# Patient Record
Sex: Male | Born: 1960 | Race: White | Hispanic: No | Marital: Married | Smoking: Never smoker
Health system: Southern US, Community
[De-identification: ages and names within clinical notes are randomized; demographics above are authoritative.]

## PROBLEM LIST (undated history)

## (undated) DIAGNOSIS — L511 Stevens-Johnson syndrome: Secondary | ICD-10-CM

## (undated) DIAGNOSIS — IMO0001 Reserved for inherently not codable concepts without codable children: Secondary | ICD-10-CM

## (undated) DIAGNOSIS — E781 Pure hyperglyceridemia: Secondary | ICD-10-CM

## (undated) DIAGNOSIS — I10 Essential (primary) hypertension: Secondary | ICD-10-CM

## (undated) HISTORY — DX: Reserved for inherently not codable concepts without codable children: IMO0001

## (undated) HISTORY — PX: HX NO SURGICAL PROCEDURES: 2100001501

## (undated) HISTORY — DX: Stevens-Johnson syndrome (CMS HCC): L51.1

## (undated) HISTORY — DX: Pure hyperglyceridemia: E78.1

## (undated) HISTORY — DX: Essential (primary) hypertension: I10

---

## 2007-03-04 ENCOUNTER — Ambulatory Visit
Admission: RE | Admit: 2007-03-04 | Disposition: A | Discharge status: Home / Self Care | Payer: Self-pay | Source: Ambulatory Visit

## 2009-06-08 ENCOUNTER — Ambulatory Visit (INDEPENDENT_AMBULATORY_CARE_PROVIDER_SITE_OTHER): Payer: No Typology Code available for payment source

## 2009-06-08 ENCOUNTER — Encounter (INDEPENDENT_AMBULATORY_CARE_PROVIDER_SITE_OTHER): Payer: Self-pay

## 2009-06-08 VITALS — BP 118/86 | HR 78 | Temp 97.8°F | Resp 16 | Ht 70.0 in | Wt 219.0 lb

## 2009-06-08 LAB — POCT RAPID STREP A: RAPID STREP A (POCT): NEGATIVE

## 2009-06-08 MED ORDER — PREDNISONE 10 MG TABLET
10.0000 mg | ORAL_TABLET | Freq: Every day | ORAL | Status: DC
Start: 2009-06-08 — End: 2010-07-28

## 2009-06-08 NOTE — Progress Notes (Signed)
In-Office Rapid Strep performed. Results are Negative.   Richard Rummage, CNA-MR

## 2009-06-08 NOTE — Progress Notes (Signed)
SUBJECTIVE:   Richard Ochoa is a 49 y.o. male who is here today for sore throat since yesterday. States his daughter had the strep last week. Denies any fever and chills. Has some congestion as well, +PND and ear pain states he cannot hear from both his ears. He often gets cerumen build up.     OBJECTIVE:  He appears well, vital signs are as noted by the nurse. Ears +cerumen impaction bilaterally.  Throat and pharynx erythematous but no exudates nor petechiae.  Neck supple. Few adenopathy in the neck more on the right side. Nose is congested. Sinuses non-tender. The chest is clear, without wheezes or rales.  Rapid strep negative    ASSESSMENT:   Impacted cerumen  Pharyngitis  rhinitis    PLAN:  Ear lavage done and was successful in removing the cerumen. Advised that he use debrox of H2O2 solution for ear wax removal regularly. Do not use Q-tips.  Prednisone for the sore throat x 3 days, warm water gargle. Symptomatic therapy suggested: push fluids, rest, gargle warm salt water, use vaporizer or mist prn and ROV prn if symptoms persist or worsen. Call or return to clinic prn if these symptoms worsen or fail to improve as anticipated.

## 2010-07-28 ENCOUNTER — Ambulatory Visit (INDEPENDENT_AMBULATORY_CARE_PROVIDER_SITE_OTHER): Payer: No Typology Code available for payment source

## 2010-07-28 ENCOUNTER — Encounter (INDEPENDENT_AMBULATORY_CARE_PROVIDER_SITE_OTHER): Payer: Self-pay

## 2010-07-28 ENCOUNTER — Ambulatory Visit: Admission: RE | Admit: 2010-07-28 | Discharge: 2010-07-28 | Payer: Self-pay | Source: Ambulatory Visit

## 2010-07-28 VITALS — BP 130/84 | HR 91 | Resp 18 | Ht 71.0 in | Wt 222.4 lb

## 2010-07-28 MED ORDER — TRAMADOL 50 MG TABLET
50.00 mg | ORAL_TABLET | Freq: Four times a day (QID) | ORAL | Status: DC | PRN
Start: 2010-07-28 — End: 2014-01-02

## 2010-07-28 MED ORDER — SULFAMETHOXAZOLE 800 MG-TRIMETHOPRIM 160 MG TABLET
1.00 | ORAL_TABLET | Freq: Two times a day (BID) | ORAL | Status: DC
Start: 2010-07-28 — End: 2010-08-01

## 2010-08-01 ENCOUNTER — Encounter (INDEPENDENT_AMBULATORY_CARE_PROVIDER_SITE_OTHER): Payer: Self-pay

## 2010-08-01 ENCOUNTER — Other Ambulatory Visit (INDEPENDENT_AMBULATORY_CARE_PROVIDER_SITE_OTHER): Payer: Self-pay

## 2010-08-01 MED ORDER — CIPROFLOXACIN 750 MG TABLET
750.00 mg | ORAL_TABLET | Freq: Two times a day (BID) | ORAL | Status: DC
Start: 2010-08-01 — End: 2014-01-02

## 2010-08-01 NOTE — Progress Notes (Signed)
Quick Note:    Will discuss on FU. Already on Bactrim.  ______

## 2010-08-02 ENCOUNTER — Telehealth (INDEPENDENT_AMBULATORY_CARE_PROVIDER_SITE_OTHER): Payer: Self-pay

## 2010-08-02 NOTE — Progress Notes (Signed)
 SUBJECTIVE:   Richard Ochoa is a 50 y.o. male who is here today for abscess on the right side of his neck which he noticed last week.  He thought he may have cut himself while shaving.  He tried to open the lesion with scissors and let it drain but nothing came out. Took antibiotic from home but not sure the name of the antibiotic. and got some pus out. No fever and chills.    OBJECTIVE:   Appears well, alert, oriented, pleasant and cooperative. Vitals are as noted by the nurse. Complete skin exam is performed. Lesion on inferior neck right side is an abscess about 1.5 cm in diameter, erythematous and firm. Tender to touch.      Procedure in clinic, consent obtained for I&D. Area prepped in a sterile manner with betadine. Lidocaine with 2% of epi was infused on the lesion for anesthesia. Abscess incised with #11 blade, some purulent discharge came out and was sent for culture. Sterile dressing applied. Patient tolerated the procedure well.       ASSESSMENT:  Abscess, neck    PLAN:   I&D done. Started on Bactrim . Specimen sent for culture. FU on Monday for re-check. May take OTC ibuprofen for pain.  Asymptomatic benign lesions can be observed for changes or symptoms over time. Symptomatic lesions can be treated if he desires; to be scheduled at a later date. Sun protection with sunscreens and clothing to prevent skin cancer is discussed. The signs and symptoms of malignant skin lesions are reviewed with him today.

## 2011-11-15 IMAGING — CR Knee 3 Views-Right
3 series · 3 of 3 positions shown · non-contrast
Comparison: none

Final Report
CLINICAL HISTORY: Pain
TECHNIQUE: AP and lateral views were performed.

There is no evidence of fracture-dislocation or other bony 
abnormality.  There are no arthritic changes.  There is a large joint 
effusion.  There is extensive ossification of the patellar ligament.

[left lateral]
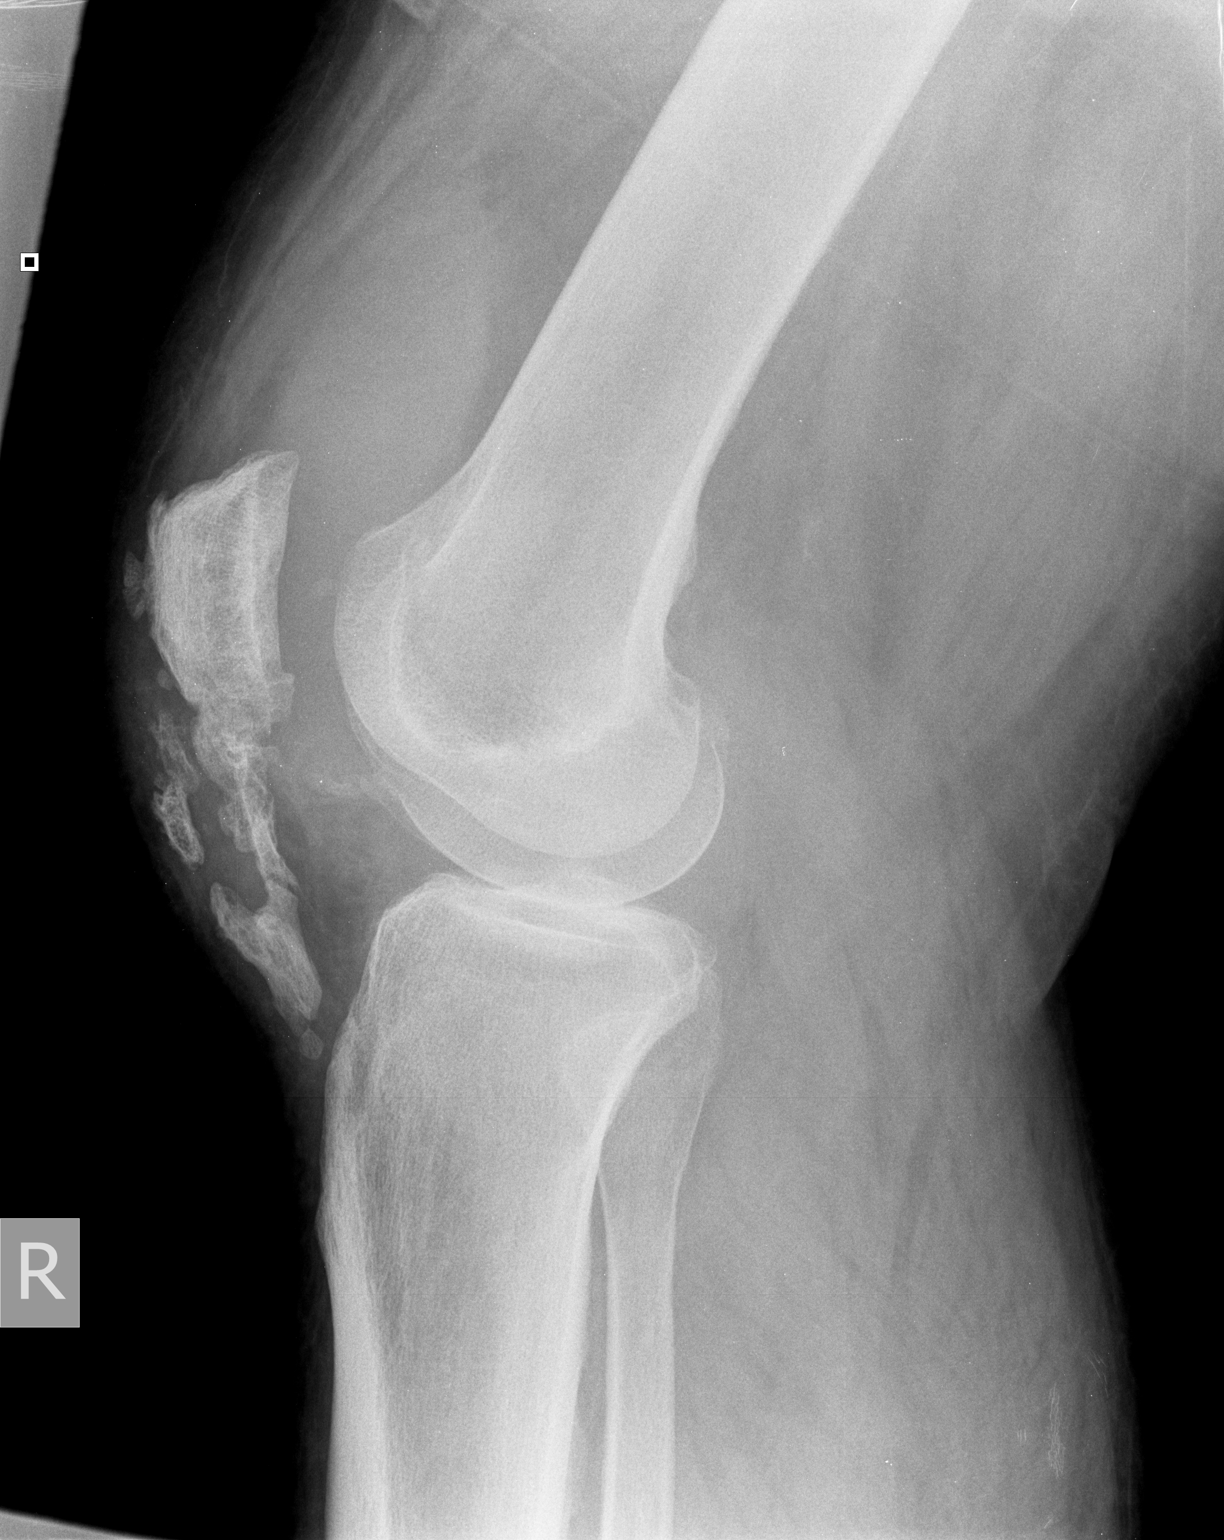

[AP (1 of 2)]
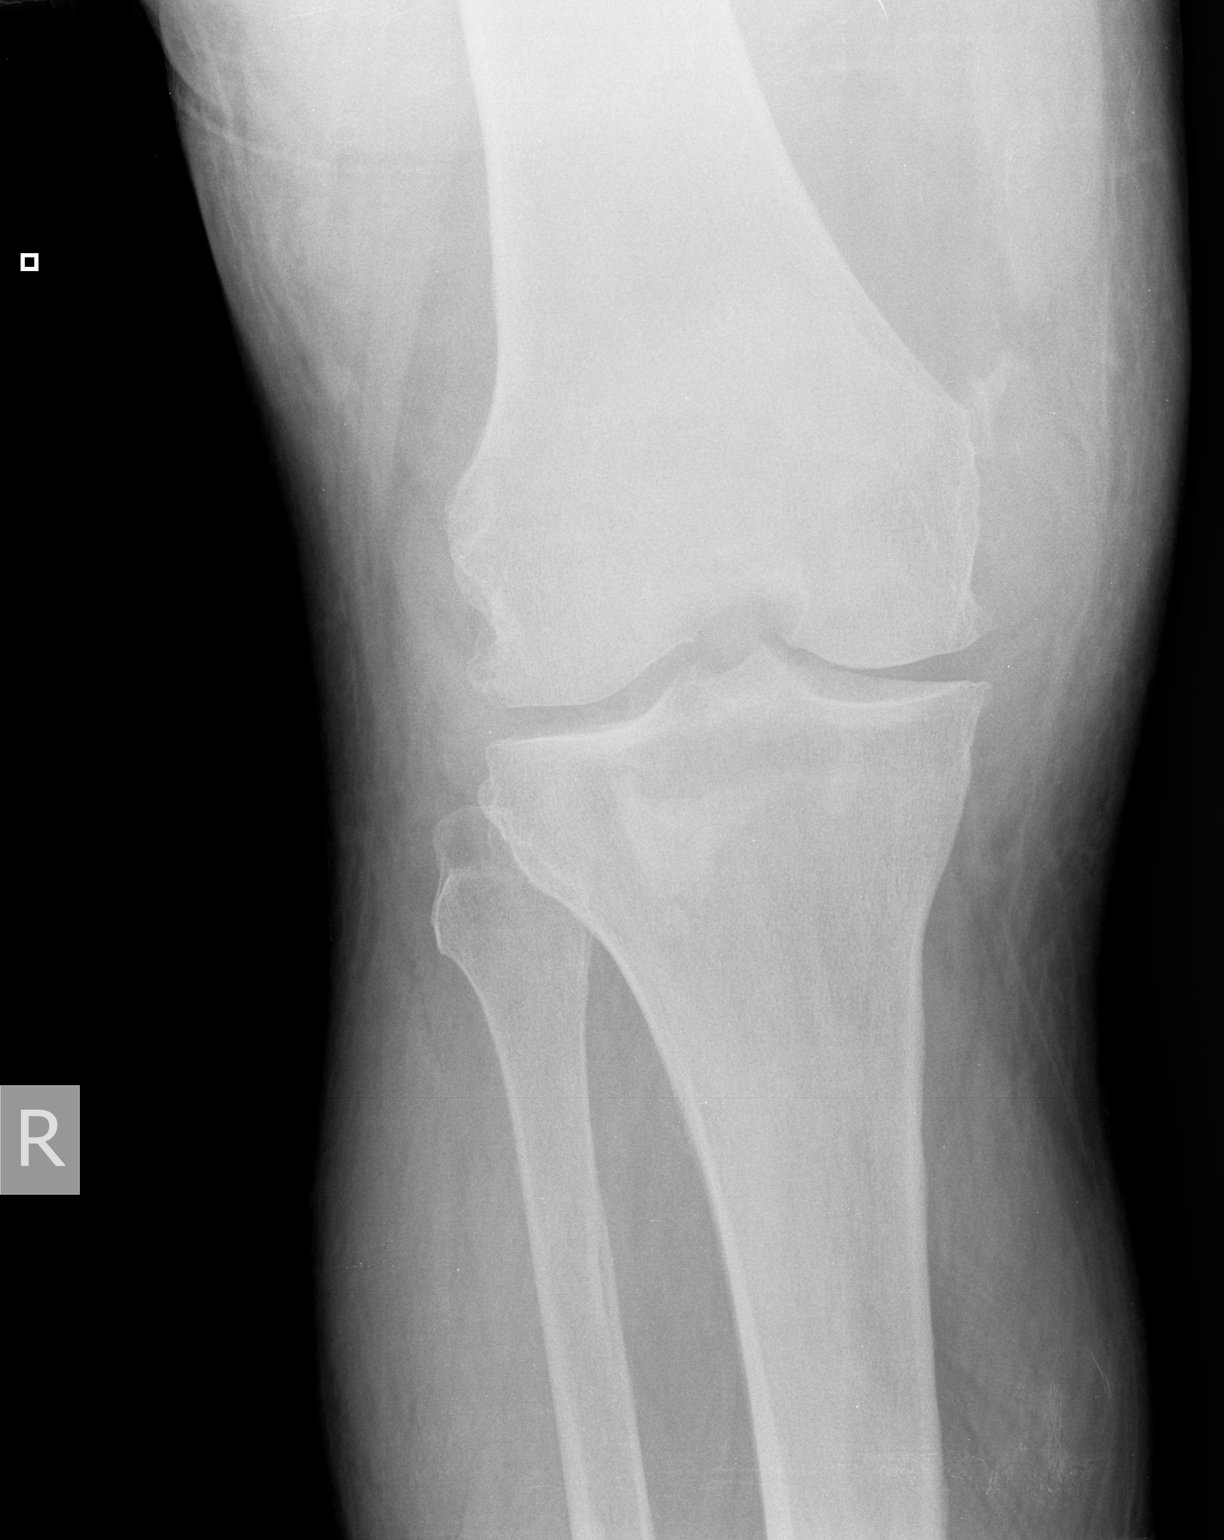

[AP (2 of 2)]
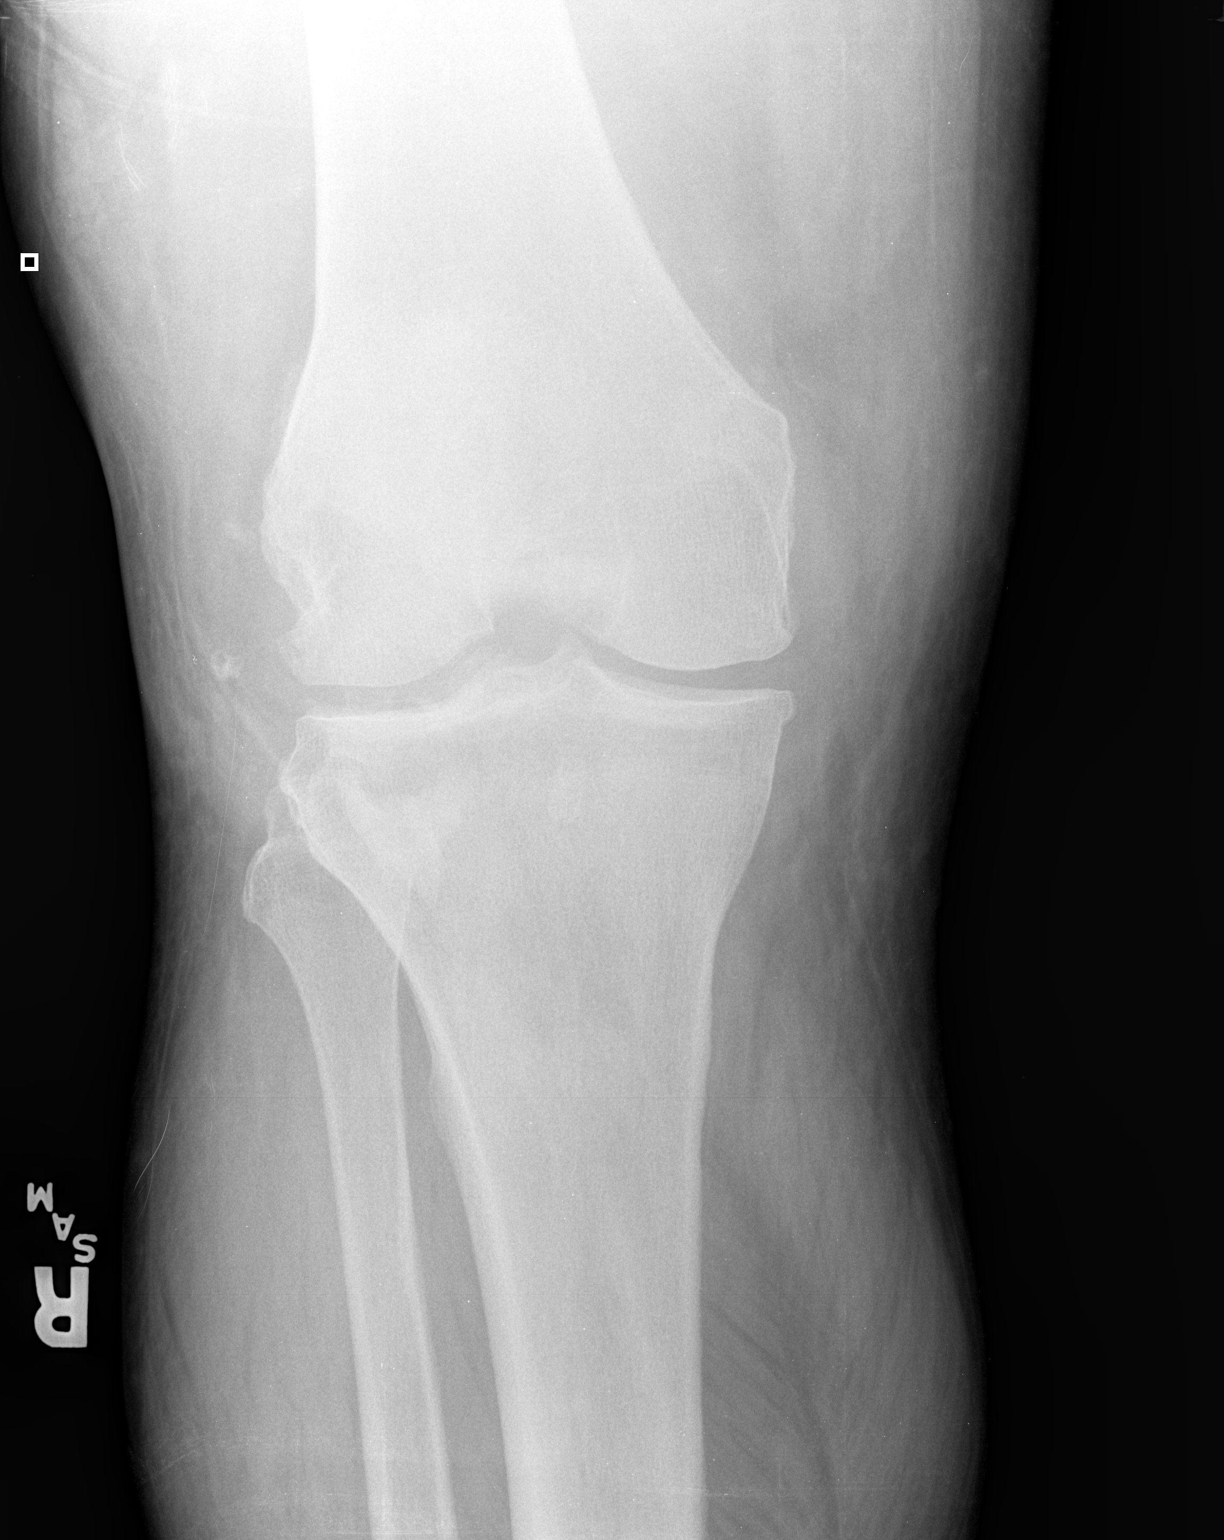

[3 of 3 positions shown; findings below may reference images not displayed]

IMPRESSION: Large joint effusion.

## 2014-01-02 ENCOUNTER — Ambulatory Visit (INDEPENDENT_AMBULATORY_CARE_PROVIDER_SITE_OTHER): Payer: No Typology Code available for payment source

## 2014-01-02 ENCOUNTER — Encounter (INDEPENDENT_AMBULATORY_CARE_PROVIDER_SITE_OTHER): Payer: Self-pay

## 2014-01-02 VITALS — BP 144/97 | HR 80 | Temp 98.2°F | Ht 70.5 in | Wt 223.0 lb

## 2014-01-02 DIAGNOSIS — J329 Chronic sinusitis, unspecified: Principal | ICD-10-CM

## 2014-01-02 DIAGNOSIS — H6690 Otitis media, unspecified, unspecified ear: Secondary | ICD-10-CM

## 2014-01-02 MED ORDER — FLUTICASONE PROPIONATE 50 MCG/ACTUATION NASAL SPRAY,SUSPENSION
1.0000 | Freq: Every day | NASAL | Status: DC
Start: 2014-01-02 — End: 2015-03-10

## 2014-01-02 MED ORDER — AMOXICILLIN 875 MG-POTASSIUM CLAVULANATE 125 MG TABLET
1.00 | ORAL_TABLET | Freq: Two times a day (BID) | ORAL | Status: DC
Start: 2014-01-02 — End: 2014-04-30

## 2014-01-02 MED ORDER — GUAIFENESIN ER 600 MG TABLET,EXTENDED RELEASE
600.00 mg | ORAL_TABLET | Freq: Two times a day (BID) | ORAL | Status: DC | PRN
Start: 2014-01-02 — End: 2015-03-10

## 2014-01-02 NOTE — Progress Notes (Signed)
Blood pressure 144/97, pulse 80, temperature 36.8 C (98.2 F), temperature source Oral, height 1.791 m (5' 10.5"), weight 101.152 kg (223 lb).  Maximiano CossJacqueline Bryauna Byrum, RTR  01/02/2014, 11:04

## 2014-01-02 NOTE — Progress Notes (Signed)
SUBJECTIVE:   Richard Ochoa is a 53 y.o. male who complains of sinus and nasal congestion, b/l ear pressure, nasal blockage, post nasal drip and productive cough for 5 days. He denies a history of fevers and denies a history of asthma.     ROS: no fever, no headaches, no wheezing or shortness of breath, no weakness or difficulty with ambulation, no skin rash or lesion    History   Substance Use Topics    Smoking status: Never Smoker     Smokeless tobacco: Not on file    Alcohol Use: Yes      Comment: Occasional     Family History   Problem Relation Age of Onset    Healthy Mother          Past Medical History   Diagnosis Date    Healthy adult          Patient Active Problem List   Diagnosis    Acute rhinosinusitis    Acute pharyngitis    Impacted cerumen       Outpatient Prescriptions Prior to Visit:  Ciprofloxacin (CIPRO) 750 mg Oral Tablet take 1 Tab by mouth Twice daily.   tramadol (ULTRAM) 50 mg Oral Tablet take 1 Tab by mouth Every 6 hours as needed.     No facility-administered medications prior to visit.     OBJECTIVE:  BP 144/97 mmHg   Pulse 80   Temp(Src) 36.8 C (98.2 F) (Oral)   Ht 1.791 m (5' 10.5")   Wt 101.152 kg (223 lb)   BMI 31.53 kg/m2  He appears well, vital signs are as noted.   Ears: normal TM's and external ear canals AU.    Throat and pharynx normal; maxillary sinuses tender to palpation b/l; left TM injected with blunted landmarks  The chest  is clear, without wheezes or rales   Heart: regular rate and rhythm  Abdomen: soft; +BS  Skin: Warm and dry, no rash or lesion        ASSESSMENT:     ICD-10-CM    1. Sinusitis J32.9    2. Otitis media H66.90        PLAN:  Abx, mucinex, Flonase  Symptomatic therapy suggested: push fluids, use vaporizer or mist prn and apply heat to sinuses prn. Pt verbalized understanding and agrees with the plan of care. Advised pt to call or return to clinic prn if these symptoms worsen or fail to improve as anticipated.    Richard PourMenna Izella Ybanez, MD 01/02/2014  11:16

## 2014-01-02 NOTE — Patient Instructions (Signed)

## 2014-04-30 ENCOUNTER — Encounter (INDEPENDENT_AMBULATORY_CARE_PROVIDER_SITE_OTHER): Payer: Self-pay

## 2014-04-30 ENCOUNTER — Ambulatory Visit (INDEPENDENT_AMBULATORY_CARE_PROVIDER_SITE_OTHER): Payer: No Typology Code available for payment source

## 2014-04-30 VITALS — BP 136/90 | HR 77 | Temp 97.8°F | Resp 16 | Ht 71.0 in | Wt 231.6 lb

## 2014-04-30 DIAGNOSIS — J329 Chronic sinusitis, unspecified: Secondary | ICD-10-CM

## 2014-04-30 MED ORDER — FLUTICASONE PROPIONATE 50 MCG/ACTUATION NASAL SPRAY,SUSPENSION
1.0000 | Freq: Every day | NASAL | Status: DC
Start: 2014-04-30 — End: 2015-03-10

## 2014-04-30 MED ORDER — AMOXICILLIN 875 MG-POTASSIUM CLAVULANATE 125 MG TABLET
1.00 | ORAL_TABLET | Freq: Two times a day (BID) | ORAL | Status: DC
Start: 2014-04-30 — End: 2015-03-10

## 2014-04-30 MED ORDER — GUAIFENESIN ER 600 MG TABLET,EXTENDED RELEASE
600.00 mg | ORAL_TABLET | Freq: Two times a day (BID) | ORAL | Status: DC | PRN
Start: 2014-04-30 — End: 2015-03-10

## 2014-04-30 NOTE — Progress Notes (Signed)
SUBJECTIVE:   Richard Ochoa is a 54 y.o. male who complains of ear ache,  sinus and nasal congestion, sore throat, nasal blockage, post nasal drip and headache for 1 week. He denies a history of wheezing and shortness of breath and denies a history of asthma.     ROS: no fever, no headaches, no wheezing or shortness of breath, no weakness or difficulty with ambulation, no skin rash or lesion    History   Substance Use Topics    Smoking status: Never Smoker     Smokeless tobacco: Never Used    Alcohol Use: Yes      Comment: Occasional     Family History   Problem Relation Age of Onset    Healthy Mother     Thyroid Disease Mother     Asthma Father     Coronary Artery Disease Father     Heart Attack Father     Cancer Maternal Aunt     Cancer Maternal Grandmother          Past Medical History   Diagnosis Date    Healthy adult     Stevens-Johnson syndrome          Patient Active Problem List   Diagnosis    Acute rhinosinusitis    Acute pharyngitis    Impacted cerumen       Outpatient Prescriptions Prior to Visit:  fluticasone (FLONASE) 50 mcg/actuation Nasal Spray, Suspension 1 Spray by Each Nostril route Once a day   guaiFENesin (MUCINEX) 600 mg Oral Tablet Sustained Release Take 1 Tab (600 mg total) by mouth Twice per day as needed   amoxicillin-pot clavulanate (AUGMENTIN) 875-125 mg Oral Tablet Take 1 Tab by mouth Every 12 hours     No facility-administered medications prior to visit.     OBJECTIVE:  BP 136/90 mmHg   Pulse 77   Temp(Src) 36.6 C (97.8 F) (Oral)   Resp 16   Ht 1.803 m (5\' 11" )   Wt 105.053 kg (231 lb 9.6 oz)   BMI 32.32 kg/m2   SpO2 97%  He appears well, vital signs are as noted.   Eyes: non injected, nonicteric  Ears: normal TM's and external ear canals AU.    Throat and pharynx normal; maxillary and frontal sinuses TTTP b/l  The chest  is clear, without wheezes or rales   Heart: regular rate and rhythm  Abd: soft, +BS  Skin: Warm and dry, no rash or lesion  Ext: no  edema      ASSESSMENT:     ICD-10-CM    1. Sinusitis J32.9        PLAN: Antibiotics, expectorant, nasal steroid   Pt verbalized understanding and agrees with the plan of care. Advised pt to call or return to clinic prn if these symptoms worsen or fail to improve as anticipated.    [name], MD 04/30/2014 10:25

## 2014-04-30 NOTE — Patient Instructions (Signed)

## 2014-04-30 NOTE — Progress Notes (Signed)
BP 136/90 mmHg   Pulse 77   Temp(Src) 36.6 C (97.8 F) (Oral)   Resp 16   Ht 1.803 m (5\' 11" )   Wt 105.053 kg (231 lb 9.6 oz)   BMI 32.32 kg/m2   SpO2 97%  Richard Ochoa, RTR  04/30/2014, 09:24

## 2014-05-01 ENCOUNTER — Encounter (INDEPENDENT_AMBULATORY_CARE_PROVIDER_SITE_OTHER): Payer: Self-pay

## 2014-05-01 NOTE — Progress Notes (Signed)
Patient seen in Urgent Care yesterday. Attempted to contact for a follow-up, no answer at this time.  Richard Ochoa, RTR  05/01/2014, 08:27

## 2015-02-12 ENCOUNTER — Other Ambulatory Visit
Admission: RE | Admit: 2015-02-12 | Discharge: 2015-02-12 | Disposition: A | Discharge status: Home / Self Care | Payer: PRIVATE HEALTH INSURANCE | Source: Ambulatory Visit | Attending: Gastroenterology | Admitting: Gastroenterology

## 2015-02-12 ENCOUNTER — Ambulatory Visit (INDEPENDENT_AMBULATORY_CARE_PROVIDER_SITE_OTHER): Payer: PRIVATE HEALTH INSURANCE | Attending: Family Medicine

## 2015-02-12 ENCOUNTER — Other Ambulatory Visit: Payer: Self-pay | Admitting: Gastroenterology

## 2015-02-12 DIAGNOSIS — Z86010 Personal history of colonic polyps: Secondary | ICD-10-CM

## 2015-03-10 ENCOUNTER — Encounter (INDEPENDENT_AMBULATORY_CARE_PROVIDER_SITE_OTHER): Payer: Self-pay | Admitting: Physician Assistant

## 2015-03-10 ENCOUNTER — Ambulatory Visit (INDEPENDENT_AMBULATORY_CARE_PROVIDER_SITE_OTHER): Payer: No Typology Code available for payment source | Admitting: Physician Assistant

## 2015-03-10 VITALS — BP 145/90 | HR 76 | Temp 98.4°F | Resp 18 | Ht 71.0 in | Wt 227.0 lb

## 2015-03-10 DIAGNOSIS — R030 Elevated blood-pressure reading, without diagnosis of hypertension: Secondary | ICD-10-CM

## 2015-03-10 DIAGNOSIS — H6123 Impacted cerumen, bilateral: Secondary | ICD-10-CM

## 2015-03-10 DIAGNOSIS — Z6831 Body mass index (BMI) 31.0-31.9, adult: Secondary | ICD-10-CM

## 2015-03-10 DIAGNOSIS — J320 Chronic maxillary sinusitis: Secondary | ICD-10-CM

## 2015-03-10 MED ORDER — AMOXICILLIN 875 MG TABLET
875.00 mg | ORAL_TABLET | Freq: Two times a day (BID) | ORAL | 0 refills | Status: AC
Start: 2015-03-10 — End: 2015-03-20

## 2015-03-10 NOTE — Progress Notes (Addendum)
SUBJECTIVE:   Richard Ochoa is a 55 y.o. male who complains of sinus and nasal congestion, nasal blockage, headache, bilateral L>R sinus pain, dry cough, productive cough and yellow and green nasal discharge for 2 days, worse since onset. pt states that he had symptoms of a cold last week with cough, PND, headache, but "I don't pay any attention to that, I just go until I can't take it anymore." He denies a history of wheezing, shortness of breath, chest pain, dizziness, nausea and vomiting and denies a history of asthma. Pt tried taking OTC cough and sinus decongestant meds without significant relief in symptoms.    ROS: no fever, +headaches, no wheezing or shortness of breath, no weakness or difficulty with ambulation, no skin rash or lesion      Social History   Substance Use Topics    Smoking status: Never Smoker    Smokeless tobacco: Never Used    Alcohol use Yes      Comment: Occasional     Allergy History as of 03/10/15     NO KNOWN DRUG ALLERGIES       Noted Status Severity Type Reaction    04/30/14 1610 Marcy Siren, RTR  Deleted       06/08/09 1259 Bolinger, Jennifer 06/08/09 Active             SULFA (SULFONAMIDES)       Noted Status Severity Type Reaction    04/30/14 0922 Marcy Siren, RTR 04/30/14 Active                 Family History   Problem Relation Age of Onset    Healthy Mother     Thyroid Disease Mother     Asthma Father     Coronary Artery Disease Father     Heart Attack Father     Cancer Maternal Aunt     Cancer Maternal Grandmother          Past Medical History:   Diagnosis Date    Healthy adult     Stevens-Johnson syndrome [facility])          Patient Active Problem List   Diagnosis    Acute rhinosinusitis    Acute pharyngitis    Impacted cerumen       Outpatient Medications Prior to Visit:  amoxicillin-pot clavulanate (AUGMENTIN) 875-125 mg Oral Tablet Take 1 Tab by mouth Every 12 hours   fluticasone (FLONASE) 50 mcg/actuation Nasal Spray, Suspension 1 Spray by Each  Nostril route Once a day   fluticasone (FLONASE) 50 mcg/actuation Nasal Spray, Suspension 1 Spray by Each Nostril route Once a day   guaiFENesin (MUCINEX) 600 mg Oral Tablet Sustained Release Take 1 Tab (600 mg total) by mouth Twice per day as needed   guaiFENesin (MUCINEX) 600 mg Oral Tablet Sustained Release Take 1 Tab (600 mg total) by mouth Twice per day as needed     No facility-administered medications prior to visit.      OBJECTIVE:  Visit Vitals    BP (!) 145/90    Pulse 76    Temp 36.9 C (98.4 F) (Oral)    Resp 18    Ht 1.803 m ( )    Wt 103 kg (227 lb)    SpO2 99%    BMI 31.66 kg/m2     He appears well, vital signs are as noted.   Ears: abnormal TM AD - could not see secondary to cerumen, abnormal TM AS - could not see  secondary to cerumen.    Throat and pharynx no erythema, tonsils absent, no exudates   Neck supple. No adenopathy in the neck. Nose clear. Maxillary sinuses +ttp L>R   The chest is clear, without wheezes or rales   Heart: regular rate and rhythm  Skin: Warm and dry, no rash or lesion    Bilateral ear flush: pt tolerated well reports improved hearing bilaterally, no erythema, no bulging or retraction of bilat TMs    ASSESSMENT:     ICD-10-CM    1. Maxillary sinusitis, unspecified chronicity J32.0 Amoxicillin (AMOXIL) 875 mg Oral Tablet   2. Excessive cerumen in ear canal, bilateral H61.23 POCT REMOVAL IMPACTED CERUMEN-NURSING   3. Elevated BP without diagnosis of hypertension R03.0        PLAN:  Amoxicillin per epic orders. Yogurt or probiotics recommended. Discussed good handwashing, may use tylenol for fever or pain Q4-6hrs.  Symptomatic therapy suggested: push fluids, rest, use acetaminophen, ibuprofen prn and ROV prn if symptoms persist or worsen. Pt verbalized understanding and agrees with the plan of care. Advised pt to call or return to clinic prn if these symptoms worsen or fail to improve as anticipated.  Monitor blood pressure, decrease salt intake, increase water  intake, and maintain healthy weight. follow up with PCP in 2 weeks.    Judeen Hammans, PA-C 03/10/2015 09:19    My supervising physician today is Dr. Roney Marion.    I have reviewed the MLP's note. However, I did not participate in direct care of this patient. Any modifications/additions/recommendations are noted below:  Roxanne Gates, MD  03/10/2015, 10:27

## 2015-03-10 NOTE — Progress Notes (Signed)
Visit Vitals    BP (!) 145/90    Pulse 76    Temp 36.9 C (98.4 F) (Oral)    Resp 18    Ht 1.803 m ( )    Wt 103 kg (227 lb)    SpO2 99%    BMI 31.66 kg/m2     Jeanelle Malling, LPN  0/96/0454, 08:32

## 2015-03-10 NOTE — Patient Instructions (Signed)
[facility]  [address] [place], [place] [address]  Phone: [phone])  Fax: [phone]               Open Mon - [phone] 8:00am - 8:00pm Ena Dawley- 8:00pm                                                              ~ Closed Thanksgiving and Christmas Day     Attending Caregiver: Mariella Saa, PA-C      Today's orders: No orders of the defined types were placed in this encounter.       Prescription(s) E-Rx to:  [place], [place]    ________________________________________________________________________  Short Term Disability and [facility] does NOT provide assistance with any disability applications.  If you feel your medical condition requires you to be on disability, you will need to follow up with  Your primary care physician or a specialist.  We apologize for any inconvenience.    For Medication Prescribed by [facility]:  As an Urgent Care facility, our clinic does NOT offer prescription refills over the telephone.    If you need more of the medication one of our medical providers prescribed, you will  Either need to be re-evaluated by [place] or see your primary care physician.    ________________________________________________________________________      It is very important that we have a phone number that is the single best way to contact you in the event that we become aware of important clinical information or concerns after your discharge.  If the phone number you provided at registration is NOT this number you should inform staff and registration prior to leaving.      Your treatment and evaluation today was focused on identifying and treating potentially emergent conditions based on your presenting signs, symptoms, and history.  The resulting initial clinical impression and treatment plan is not intended to be definitive or a substitute for a full physical examination and evaluation by  your primary care provider.  If your symptoms persist, worsen, or you develop any new or concerning symptoms, you need to be evaluated.      If you received x-rays during your visit, be aware that the final and formal interpretation of those films by a radiologist may occur after your discharge.  If there is a significant discrepancy identified after your discharge, we will contact you at the telephone number provided at registration.      If you received a pelvic exam, you may have cultures pending for sexually transmitted diseases.  Positive cultures are reported to the [place] Department of Health as required by state law.  You should be contacted if you cultures are positive.  We will not contact you if they are negative.  You did NOT receive a PAP smear (the screening test for cervical).  This specific test for women is best performed by your gynecologist or primary care provider when indicated.      If you are over 53 year old, we cannot discuss your personal health information with a parent, spouse, family member, or anyone else without your express consent.  This does not include those  who have legitimate access to your records and information to assist in your care under the provisions of HIPAA [facility] Portability and Accountability Act) law, or those to whom you have previously given express written consent to do so, such a legal guardian or Power of [place].      You may have received medication that may cause you to feel drowsy and/or light headed for several hours.  You may even experience some amnesia of your stay.  You should avoid operating a motor vehicle or performing any activity requiring complete alertness or coordination until you feel fully awake (approximately 24-48 hours).  Avoid alcoholic beverages.  You may also have a dry mouth for several hours.  This is a normal side effect and will disappear as the effects of the medication wear off.      Instructions discussed with patient upon  discharge by clinical staff with all questions answered.  Please call [facility] [phone]) if any further questions.  Go immediately to the emergency department if any concern or worsening symptoms.      Judeen Hammans, PA-C 03/10/2015, 08:52

## 2015-03-11 ENCOUNTER — Encounter (INDEPENDENT_AMBULATORY_CARE_PROVIDER_SITE_OTHER): Payer: Self-pay | Admitting: Physician Assistant

## 2015-03-11 NOTE — Progress Notes (Signed)
Patient was seen at Urgent Care yesterday. Attempted to call for follow up, nPHONE NUMBER HAS BEEN DISCONNECTED.   [place] No

## 2019-01-01 ENCOUNTER — Other Ambulatory Visit (HOSPITAL_COMMUNITY): Payer: Self-pay

## 2019-01-01 LAB — EXTERNAL COVID-19 MOLECULAR RESULT: External 2019-n-CoV/SARS-CoV-2: POSITIVE — AB

## 2019-01-02 ENCOUNTER — Other Ambulatory Visit (HOSPITAL_COMMUNITY): Payer: Self-pay

## 2019-01-02 LAB — EXTERNAL COVID-19 MOLECULAR RESULT: External 2019-n-CoV/SARS-CoV-2: POSITIVE — AB

## 2019-05-01 ENCOUNTER — Encounter: Payer: Self-pay | Admitting: Family Medicine

## 2019-05-01 DIAGNOSIS — Z8249 Family history of ischemic heart disease and other diseases of the circulatory system: Secondary | ICD-10-CM

## 2019-05-14 ENCOUNTER — Ambulatory Visit
Admission: RE | Admit: 2019-05-14 | Discharge: 2019-05-14 | Disposition: A | Discharge status: Home / Self Care | Payer: 59 | Source: Ambulatory Visit | Attending: Family Medicine | Admitting: Family Medicine

## 2019-05-14 DIAGNOSIS — I10 Essential (primary) hypertension: Secondary | ICD-10-CM | POA: Insufficient documentation

## 2019-05-14 DIAGNOSIS — E785 Hyperlipidemia, unspecified: Secondary | ICD-10-CM | POA: Insufficient documentation

## 2019-05-14 DIAGNOSIS — Z8249 Family history of ischemic heart disease and other diseases of the circulatory system: Secondary | ICD-10-CM | POA: Insufficient documentation

## 2019-05-14 DIAGNOSIS — I493 Ventricular premature depolarization: Secondary | ICD-10-CM | POA: Insufficient documentation

## 2019-05-14 NOTE — Discharge Instructions (Signed)
Exercise Stress Test  An exercise stress test shows your heart's response to exercise. Your healthcare provider often gives you this test to evaluate the blood flow to your heart, your exercise tolerance, your heart's pumping pattern at different levels of work, or the presence of a heart rhythm disturbance. The test records your heartbeat while you walk on a treadmill or ride a stationary bike. The test can be done in a hospital, a test center, or your healthcare provider's office. The test is also called a stress electrocardiogram (ECG). This test is only appropriate in those who have the ability to participate in exercise and have no contraindications such as risk of falls.    Before your test  Tips to being prepared before your test include the following:   Be sure to mention the prescription and over-the-counter medicines you take. Ask if it's OK to take them beforethetest. Include any supplements or herbal medicines.   Follow any instructions from your provider about not eating or drinking before the test.   Don't smoke or have any caffeine for 3 hours before the test, or as directed by your healthcare provider.   Don't exercise right before the test.  Getting ready  Tips can help you get ready for your test:   Wearcomfortable walking or running shoes.   Wear a shirt that you can remove easily. You may be asked to remove your clothing from the waist up. Women may wear a gown.  During your test  Here is what to expect at the time of the test:   Staff will put small pads (electrodes) on your upper body and a blood pressure cuff on your arm. These are used to monitor your heartbeat and blood pressure during and after the test.   You are shown how to use the treadmill or bike.   You are then asked to exercise for several minutes. Expect the exercise to be easy at first. It will slowly get harder, with an increase in speed and incline every few minutes. This happens to monitor your heart as  it's forced to work harder.   Exercise as long as you can, or until you are asked to stop.   You will be asked to stop if you have certain symptoms. Another test to evaluate your heart may then be ordered.  Be sure to tellyour healthcare provider if you feel any of the following:   Chest, arm, or jaw pain or discomfort   Severe shortness of breath   Fatigue   Dizziness   Leg cramps or soreness   Faintness   Palpitations  After the test  Here is what to expect after your test:   You can resume your normal activity, unless otherwise instructed.   The results are sent to your healthcare provider.   Be sure to keep your follow-up appointment to learn the results of this test and what they mean.  Report any symptoms  Be sure to tellyour healthcare provider if you feel:   Chest, arm, or jaw discomfort   Severe shortness of breath   Fatigue   Dizziness   Leg cramps or soreness   Faintness   Palpitations  [employer] last reviewed this educational content on 11/23/2017   2000-2020 [employer] [address], [place] [address]. All rights reserved. This information is not intended as a substitute for professional medical care. Always follow your healthcare professional's instructions.

## 2019-05-14 NOTE — Progress Notes (Addendum)
Outpatient    Pt completed exercise cardiac stress test.  THR achieved. + htn t/o stress. Tolerated well.    Discharge instructions given to patient.  Questions were answered.     Lebron Conners, RN, RRT  Nuclear Medicine Stress Lab RN  [phone]

## 2019-05-14 NOTE — Progress Notes (Signed)
Regular stress test - 10:00 min; 12.8 mets; 105% thr; no st changes or chest pain; pvcs/couplets; htn with hypertensive response; treadmill only - no images pending / [place] [id number]

## 2019-10-28 ENCOUNTER — Ambulatory Visit: Payer: 59 | Attending: Family Medicine | Admitting: Internal Medicine

## 2019-10-28 ENCOUNTER — Encounter (HOSPITAL_BASED_OUTPATIENT_CLINIC_OR_DEPARTMENT_OTHER): Payer: 59 | Admitting: Internal Medicine

## 2019-10-28 VITALS — HR 86 | Ht 71.0 in | Wt 204.0 lb

## 2019-10-28 DIAGNOSIS — G4733 Obstructive sleep apnea (adult) (pediatric): Secondary | ICD-10-CM

## 2019-10-28 DIAGNOSIS — R0683 Snoring: Secondary | ICD-10-CM

## 2019-10-28 DIAGNOSIS — G4710 Hypersomnia, unspecified: Secondary | ICD-10-CM

## 2019-10-28 NOTE — Progress Notes (Signed)
Subjective:    Patient ID: Craig Long is a 59 y.o. male.    Chief complaint here for obstructive sleep apnea.  Back in 2012 patient was diagnosed with sleep apnea.  At that time he was snoring and sluggish.  He notes he is trying to get healthier so he wants to revisit that issue.  His sleep can still be nonrefreshing.  Typically goes to bed around 10 PM but sleep onset may take 45 minutes to fall asleep.  He wakes up twice per night.  He often does not know why.  He is usually up at 6:30 in the morning.  Denies dry mouth.  Does have an occasional headache.  He usually takes a 40-minute nap in the afternoon.  There is a history of snoring and witnessed apneas.  Denies any family history of sleep disordered breathing.  Overall he is worried about the sleep apnea that he has been diagnosed with since both his brother and his grandfather passed away in their sleep.      Review of Systems   Constitutional: Positive for fatigue.        Snoring and nocturnal arousals noted   All other systems reviewed and are negative.            Objective:    Physical Exam  Vitals reviewed.   Constitutional:       General: He is not in acute distress.     Appearance: Normal appearance.   HENT:      Head: Normocephalic and atraumatic.      Nose: Nose normal.   Eyes:      General: No scleral icterus.     Extraocular Movements: Extraocular movements intact.      Conjunctiva/sclera: Conjunctivae normal.      Pupils: Pupils are equal, round, and reactive to light.   Cardiovascular:      Rate and Rhythm: Normal rate and regular rhythm.      Pulses: Normal pulses.      Heart sounds: Normal heart sounds.   Pulmonary:      Effort: Pulmonary effort is normal. No respiratory distress.      Breath sounds: Normal breath sounds. No wheezing.   Abdominal:      General: Bowel sounds are normal. There is no distension.      Palpations: Abdomen is soft.   Musculoskeletal:         General: No swelling or deformity. Normal range of motion.       Cervical back: Normal range of motion and neck supple. No rigidity.   Skin:     General: Skin is warm.      Coloration: Skin is not jaundiced.      Findings: No bruising.   Neurological:      General: No focal deficit present.      Mental Status: He is alert and oriented to person, place, and time. Mental status is at baseline.      Cranial Nerves: No cranial nerve deficit.   Psychiatric:         Mood and Affect: Mood normal.         Behavior: Behavior normal.       Polysomnogram from 2012 has an AHI of 12.  6 minutes were less than 89%      Assessment:     1. Snoring    2. Daytime hypersomnia    3. Obstructive sleep apnea syndrome         Plan:       *  One.  OSA-he snores.  He is tired.  Sleep apnea was mild.  Never treated.  We need to see how much sleep apnea there is currently in and make decisions if further treatment is needed

## 2019-11-04 ENCOUNTER — Telehealth: Payer: Self-pay | Admitting: Internal Medicine

## 2019-11-24 ENCOUNTER — Ambulatory Visit: Payer: 59

## 2019-11-24 ENCOUNTER — Ambulatory Visit
Admission: RE | Admit: 2019-11-24 | Discharge: 2019-11-24 | Disposition: A | Discharge status: Home / Self Care | Payer: 59 | Source: Ambulatory Visit | Attending: Internal Medicine | Admitting: Internal Medicine

## 2019-11-24 DIAGNOSIS — G4710 Hypersomnia, unspecified: Secondary | ICD-10-CM | POA: Insufficient documentation

## 2019-11-24 DIAGNOSIS — G4733 Obstructive sleep apnea (adult) (pediatric): Secondary | ICD-10-CM | POA: Insufficient documentation

## 2019-11-24 DIAGNOSIS — R0683 Snoring: Secondary | ICD-10-CM | POA: Insufficient documentation

## 2019-11-24 NOTE — Progress Notes (Signed)
PT was instructed on the use and care of the [facility] device. He will return it tomorrow. Steffanie Rainwater, RPSGT

## 2019-11-26 ENCOUNTER — Encounter (INDEPENDENT_AMBULATORY_CARE_PROVIDER_SITE_OTHER): Payer: Self-pay

## 2019-12-02 NOTE — Procedures (Signed)
TYPE OF STUDY: Home study.  Clinic study.    TOTAL RECORDING TIME: 7 hours and 17 minutes.    TOTAL SLEEP TIME: 6 hours and 36 minutes.    FINDINGS: Sleep latency is 15 minutes.  REM latency is reduced  at 55 minutes.  REM sleep is normal at 24%. 0.6 minutes were  less than 88%.  Minimum sat was 83%.  The patient slept  predominantly on sides and stomach.  Snoring was noted.  49  respiratory events were seen.  AHI is 5.6.  RDI is 7.8.    IMPRESSION: Mild sleep apnea.    RECOMMENDATION: Mild sleep apnea should be treated if the  patient has significant clinical symptoms.        [id number]  DD: 12/02/2019 14:07:42  DT: 12/02/2019 16:51:31  JOB: [id number]

## 2019-12-08 ENCOUNTER — Encounter (INDEPENDENT_AMBULATORY_CARE_PROVIDER_SITE_OTHER): Payer: Self-pay | Admitting: Family

## 2019-12-08 ENCOUNTER — Ambulatory Visit (INDEPENDENT_AMBULATORY_CARE_PROVIDER_SITE_OTHER): Payer: 59 | Admitting: Family

## 2019-12-08 VITALS — BP 129/77 | HR 90 | Temp 97.7°F | Resp 20 | Ht 71.0 in | Wt 209.8 lb

## 2019-12-08 DIAGNOSIS — J3489 Other specified disorders of nose and nasal sinuses: Secondary | ICD-10-CM

## 2019-12-08 DIAGNOSIS — J019 Acute sinusitis, unspecified: Secondary | ICD-10-CM

## 2019-12-08 DIAGNOSIS — B9689 Other specified bacterial agents as the cause of diseases classified elsewhere: Secondary | ICD-10-CM

## 2019-12-08 MED ORDER — AMOXICILLIN 875 MG-POTASSIUM CLAVULANATE 125 MG TABLET
1.00 | ORAL_TABLET | Freq: Two times a day (BID) | ORAL | 0 refills | Status: AC
Start: 2019-12-08 — End: 2019-12-15

## 2019-12-08 NOTE — Nursing Note (Signed)
BP 129/77 (Site: Right, Patient Position: Sitting, Cuff Size: Adult)   Pulse 90   Temp 36.5 C (97.7 F) (Tympanic)   Resp 20   Ht 1.803 m (5\' 11" )   Wt 95.2 kg (209 lb 12.8 oz)   SpO2 98%   BMI 29.26 kg/m     , LPN  Jeanelle Malling, 10:45

## 2019-12-08 NOTE — Patient Instructions (Signed)
URGENT CARE, [facility]  [address]  [place] [address]  Phone: [phone]  Fax: [phone]           Open Daily 8:00am - 8:00pm, except Sundays 12pm-8pm         ~ Closed Thanksgiving and Christmas Day     Attending Caregiver: Buck Mam, NP    Today's orders:   Orders Placed This Encounter    amoxicillin-pot clavulanate (AUGMENTIN) 875-125 mg Oral Tablet        Prescription(s) E-Rx to:  [facility] - [place], [place] - [address]    ________________________________________________________________________  Short Term Disability and Family Medical Leave Act  [facility] does NOT provide assistance with any disability applications.  If you feel your medical condition requires you to be on disability, you will need to follow up with  Your primary care physician or a specialist.  We apologize for any inconvenience.    For Medication Prescribed by [facility]:  As an Urgent Care facility, our clinic does NOT offer prescription refills over the telephone.    If you need more of the medication one of our medical providers prescribed, you will  Either need to be re-evaluated by [place] or see your primary care physician.    ________________________________________________________________________      It is very important that we have a phone number that is the single best way to contact you in the event that we become aware of important clinical information or concerns after your discharge.  If the phone number you provided at registration is NOT this number you should inform staff and registration prior to leaving.      Your treatment and evaluation today was focused on identifying and treating potentially emergent conditions based on your presenting signs, symptoms, and history.  The resulting initial clinical impression and treatment plan is not intended to be definitive or a substitute for a full physical examination and evaluation by your primary care provider.   If your symptoms persist, worsen, or you develop any new or concerning symptoms, you need to be evaluated.      If you received x-rays during your visit, be aware that the final and formal interpretation of those films by a radiologist may occur after your discharge.  If there is a significant discrepancy identified after your discharge, we will contact you at the telephone number provided at registration.      If you received a pelvic exam, you may have cultures pending for sexually transmitted diseases.  Positive cultures are reported to the [facility] Department of Health as required by state law.  You should be contacted if you cultures are positive.  We will not contact you if they are negative.  You did NOT receive a PAP smear (the screening test for cervical).  This specific test for women is best performed by your gynecologist or primary care provider when indicated.      If you are over 15 year old, we cannot discuss your personal health information with a parent, spouse, family member, or anyone else without your express consent.  This does not include those who have legitimate access to your records and information to assist in your care under the provisions of HIPAA [facility] Portability and Accountability Act) law, or those to whom you have previously given express written consent to do so, such a legal guardian or Power of [place].      You may have received medication that may  cause you to feel drowsy and/or light headed for several hours.  You may even experience some amnesia of your stay.  You should avoid operating a motor vehicle or performing any activity requiring complete alertness or coordination until you feel fully awake (approximately 24-48 hours).  Avoid alcoholic beverages.  You may also have a dry mouth for several hours.  This is a normal side effect and will disappear as the effects of the medication wear off.      Instructions discussed with patient upon discharge by clinical staff  with all questions answered.  Please call [facility] [phone] if any further questions.  Go immediately to the emergency department if any concern or worsening symptoms.    Buck Mam, NP 12/08/2019, 11:47

## 2019-12-08 NOTE — Progress Notes (Signed)
URGENT CARE, [facility]  [address]  [place] [address]       Name: Richard Ochoa MRN:  [id number]   Date of Birth: 18-Jan-1961 Age: 59 y.o.   Date: 12/08/2019  Time: 11:40     Provider: Buck Mam, NP  PCP: No Pcp    Reason for visit:   Chief Complaint              Ear Pain     Drainage Back Of Throat     Sinus Infection     Facial Pain            History of Present Illness:   Richard Ochoa is a 59 y.o. male presenting with bilateral ear pain for 2 weeks and sinus drainage for 2-3 days. States that he typically gets yearly sinus infections. Requesting a "cillin" today. Took an old antibiotic last night that his wife gave him. Denies fevers and body aches. 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 No concerns for Covid and does not want to be tested. Vaccinated with [employer].     Past Medical History:     Past Medical History:   Diagnosis Date    Healthy adult     Stevens-Johnson syndrome (CMS HCC)      Past Surgical History:     Past Surgical History:   Procedure Laterality Date    HX NO SURGICAL PROCEDURES       Allergies:     Allergies   Allergen Reactions    Sulfa (Sulfonamides)      Medications:     Current Outpatient Medications   Medication Sig    amoxicillin-pot clavulanate (AUGMENTIN) 875-125 mg Oral Tablet Take 1 Tablet by mouth Twice daily for 7 days    lisinopriL (PRINIVIL) 40 mg Oral Tablet take 1 tablet by oral route every day     Family History:     Family Medical History:       Problem Relation (Age of Onset)    Asthma Father    Cancer Maternal Aunt, Maternal Grandmother    Coronary Artery Disease Father    Healthy Mother    Heart Attack Father    Thyroid Disease Mother          Social History:     Social History     Socioeconomic History    Marital status: Married     Spouse name: Tersa     Number of children: 2    Years of education: Not on file    Highest education level: Not on file   Tobacco Use    Smoking status: Never Smoker    Smokeless tobacco: Never Used    Substance and Sexual Activity    Alcohol use: Yes     Comment: Occasional   Other Topics Concern     Review of Systems:   Constitutional: negative for fevers, chills, fatigue  Eyes: negative for visual disturbance, irritation or redness  Ears, nose, mouth, throat, and face: + ear pain, + sinus drainage   Respiratory: no cough, no wheezing, no shortness of breath  Musculoskeletal: negative for myalgias    All other pertinent ROS addressed in HPI  Physical Exam:   Vital Signs:  Vitals:    12/08/19 1044   BP: 129/77   Pulse: 90   Resp: 20   Temp: 36.5 C (97.7 F)   TempSrc: Tympanic   SpO2: 98%   Weight: 95.2 kg (209 lb 12.8 oz)   Height: 1.803 m (  5\' 11" )   BMI: 29.32      General: pleasant male who appears stated age in no acute distress.   Eyes: pupils equal and round, reactive to light.   Conjunctivae/corneas clear  HENT: normocephalic, atraumatic. Nose without erythema or polyps. + rhinorrhea. Mouth mucous membranes moist, pharynx not injected, no exudate, no oral lesions, +PND, TMs intact- no erythema    Neck: supple, + cervical lymphadenopathy   Lungs: clear to auscultation bilaterally  Cardiovascular: heart regular rate and rhythm, no murmur, click, rub or gallop  Neurologic: gait is normal, alert and oriented x3  Psychiatric: affect normal    Assessment:       ICD-10-CM    1. Acute bacterial sinusitis  J01.90     B96.89    2. Sinus drainage  J34.89       Plan:     Medication Orders   Medications    amoxicillin-pot clavulanate (AUGMENTIN) 875-125 mg Oral Tablet     Sig: Take 1 Tablet by mouth Twice daily for 7 days     Dispense:  14 Tablet     Refill:  0     Acute bacterial sinusitis:  Take antibiotic as prescribed  Yogurt/Probiotic during duration of treatment  Advised daily Zyrtec and Flonase  To follow up if symptoms do not improve as anticipated     Supervising Physician: Dr. , NP     Portions of this note may be dictated using voice recognition software or a dictation  service. Variances in spelling and vocabulary are possible and unintentional. Not all errors are caught/corrected. Please notify the Alice Rieger if any discrepancies are noted or if the meaning of any statement is not clear.     I have reviewed the APP's note.  I did not participate in direct care of this patient. Any modifications/additions/recommendations are noted below:  Thereasa Parkin, MD  12/08/2019, 16:11

## 2019-12-09 ENCOUNTER — Telehealth: Payer: Self-pay | Admitting: Internal Medicine

## 2019-12-09 NOTE — Telephone Encounter (Signed)
Spoke with pt re mild sleep apnea seen in hst. Dr. Jacqlyn Larsen states treatment only required if pt has significant clinical symptoms which he does not. Pt was advised of these results and is okay to not move forward with treatment at this time. as

## 2021-07-29 ENCOUNTER — Ambulatory Visit (INDEPENDENT_AMBULATORY_CARE_PROVIDER_SITE_OTHER): Payer: 59 | Admitting: Student in an Organized Health Care Education/Training Program

## 2021-07-29 ENCOUNTER — Encounter (INDEPENDENT_AMBULATORY_CARE_PROVIDER_SITE_OTHER): Payer: Self-pay | Admitting: Student in an Organized Health Care Education/Training Program

## 2021-07-29 ENCOUNTER — Other Ambulatory Visit: Payer: Self-pay

## 2021-07-29 VITALS — BP 143/90 | HR 71 | Temp 97.8°F | Ht 70.0 in | Wt 222.2 lb

## 2021-07-29 DIAGNOSIS — S81851A Open bite, right lower leg, initial encounter: Secondary | ICD-10-CM

## 2021-07-29 DIAGNOSIS — Z23 Encounter for immunization: Secondary | ICD-10-CM

## 2021-07-29 DIAGNOSIS — J019 Acute sinusitis, unspecified: Secondary | ICD-10-CM

## 2021-07-29 DIAGNOSIS — T63441A Toxic effect of venom of bees, accidental (unintentional), initial encounter: Secondary | ICD-10-CM

## 2021-07-29 DIAGNOSIS — W540XXA Bitten by dog, initial encounter: Secondary | ICD-10-CM

## 2021-07-29 DIAGNOSIS — R519 Headache, unspecified: Secondary | ICD-10-CM

## 2021-07-29 DIAGNOSIS — H938X3 Other specified disorders of ear, bilateral: Secondary | ICD-10-CM

## 2021-07-29 MED ORDER — AMOXICILLIN 875 MG-POTASSIUM CLAVULANATE 125 MG TABLET
1.00 | ORAL_TABLET | Freq: Two times a day (BID) | ORAL | 0 refills | Status: AC
Start: 2021-07-29 — End: 2021-08-05

## 2021-07-29 NOTE — Progress Notes (Signed)
[facility]  URGENT CARE, [facility]  [address] [place] [address]  [phone]  Today's date: 07/29/21    Patient Name: Richard Ochoa  Patient DOB: 1960-06-29      SUBJECTIVE:   Richard Ochoa is a 61 y.o. male who complains of b/l ear fullness for 2 weeks.  He has sinus congestion, pressure, drainage for 2 weeks too.  He feels like it's causing his headache and upper shoulder back pain too.  Also had a bee sting to the right knee 5 days ago.  Also a right leg dog bite 3 days ago that was his daughter's dog. Dog is immunized and got in a fight with their smaller dog because he was "yapping" a lot and patient tried to separate them.   Unknown last tetanus shot.  He denies a history of chills, fevers, myalgias, wheezing, shortness of breath, chest pain, vomiting and anorexia.       PMH:  Current Outpatient Medications   Medication Sig   . amoxicillin-pot clavulanate (AUGMENTIN) 875-125 mg Oral Tablet Take 1 Tablet by mouth Twice daily for 7 days   . lisinopriL (PRINIVIL) 40 mg Oral Tablet take 1 tablet by oral route every day     Allergies   Allergen Reactions   . Sulfa (Sulfonamides)      Past Medical History:   Diagnosis Date   . Healthy adult    . Stevens-Johnson syndrome (CMS HCC)          Past Surgical History:   Procedure Laterality Date   . HX NO SURGICAL PROCEDURES           Family Medical History:     Problem Relation (Age of Onset)    Asthma Father    Cancer Maternal Aunt, Maternal Grandmother    Coronary Artery Disease Father    Healthy Mother    Heart Attack Father    Thyroid Disease Mother          Social History     Socioeconomic History   . Marital status: Married     Spouse name: Carlos Levering    . Number of children: 2   Tobacco Use   . Smoking status: Never   . Smokeless tobacco: Never   Substance and Sexual Activity   . Alcohol use: Yes     Comment: Occasional     Social History     Tobacco Use   Smoking Status Never   Smokeless Tobacco  Never       OBJECTIVE:  BP (!) 143/90 (Site: Left, Patient Position: Sitting, Cuff Size: Adult)   Pulse 71   Temp 36.6 C (97.8 F) (Tympanic)   Ht 1.778 m (5\' 10" )   Wt 101 kg (222 lb 3.2 oz)   SpO2 98%   BMI 31.88 kg/m     Appearance: in no apparent distress, well developed and well nourished, non-toxic, in no respiratory distress and acyanotic and alert.   Skin:  Clean. Dry and warm to touch. No rash on arms or face  ENT: TM's with serous effusions noted b/l, no purulence, pharynx erythematous without exudate, post nasal drip noted and nasal mucosa congested.   Cardiovascular:    Heart regular rate and rhythm  Pulmonary Lungs: non-labored breathing, clear to auscultation bilaterally.  No rales, wheezes or rhonchi appreciated.   Right lower ext: right knee with bee sting site, small 1 cm red papule and center 2 mm scab site, also 3  roughly 1 cm or less wounds with scabs and mild local < 19mm surrounding erythema, some bruising noted in this area too but FROM of the right knee, leg and extremities noted.   Neck: TTP of the proximal half of trapezius b/l without stiffness. FROM neck noted.     ASSESSMENT/PLAN    ICD-10-CM    1. Acute non-recurrent sinusitis, unspecified location  J01.90 amoxicillin-pot clavulanate (AUGMENTIN) 875-125 mg Oral Tablet      2. Ear fullness, bilateral  H93.8X3       3. Headache  R51.9       4. Dog bite of right lower leg, initial encounter  S81.851A amoxicillin-pot clavulanate (AUGMENTIN) 875-125 mg Oral Tablet    W54.0XXA Tdap Vaccine - Boostrix - >=10 yrs (Admin)      5. Bee sting, accidental or unintentional, initial encounter  T63.441A Tdap Vaccine - Boostrix - >=10 yrs (Admin)        Medication Orders   Medications   . amoxicillin-pot clavulanate (AUGMENTIN) 875-125 mg Oral Tablet     Sig: Take 1 Tablet by mouth Twice daily for 7 days     Dispense:  14 Tablet     Refill:  0       Acute sinusitis and serous otitis media  Augmentin as above  Ibuprofen/tylenol PRN  Symptomatic  therapy suggested: push fluids, rest, (vitamin C, vitamin D and zinc if appropriate reviewed), gargle warm salt water, use vaporizer or mist prn and use acetaminophen, cough suppressant of choice prn.   Call or return to clinic prn if these symptoms worsen or fail to improve as anticipated.  Concerning symptoms and red flags reviewed to f/u in the ER.    Bee sting  Healling well, no infection appreciated  Tetanus updated today    Right leg dog bites  Tetanus updated today  Appears to be healing well  Monitor wound and f/u prn    Portions of this note may be dictated using voice recognition software or a dictation service. Variances in spelling and vocabulary are possible and unintentional. Not all errors are caught/corrected. Please notify the Thereasa Parkin if any discrepancies are noted or if the meaning of any statement is not clear.     Fredricka Bonine, MD

## 2021-07-29 NOTE — Patient Instructions (Signed)
URGENT CARE, [facility]  [address]  [place] [address]  Phone: [phone]  Fax: [phone]           Open Daily 8:00am - 8:00pm, except Sundays 12pm-8pm         ~ Closed Thanksgiving and Christmas Day     Attending Caregiver: Fredricka Bonine, MD    Today's orders:   Orders Placed This Encounter    Tdap Vaccine - Boostrix - >=10 yrs (Admin)    amoxicillin-pot clavulanate (AUGMENTIN) 875-125 mg Oral Tablet        Prescription(s) E-Rx to:  [facility] - [place], [place] - [address]    ________________________________________________________________________  Short Term Disability and Family Medical Leave Act  [facility] does NOT provide assistance with any disability applications.  If you feel your medical condition requires you to be on disability, you will need to follow up with  Your primary care physician or a specialist.  We apologize for any inconvenience.    For Medication Prescribed by [facility]:  As an Urgent Care facility, our clinic does NOT offer prescription refills over the telephone.    If you need more of the medication one of our medical providers prescribed, you will  Either need to be re-evaluated by [place] or see your primary care physician.    ________________________________________________________________________      It is very important that we have a phone number that is the single best way to contact you in the event that we become aware of important clinical information or concerns after your discharge.  If the phone number you provided at registration is NOT this number you should inform staff and registration prior to leaving.      Your treatment and evaluation today was focused on identifying and treating potentially emergent conditions based on your presenting signs, symptoms, and history.  The resulting initial clinical impression and treatment plan is not intended to be definitive or a substitute for a full physical examination  and evaluation by your primary care provider.  If your symptoms persist, worsen, or you develop any new or concerning symptoms, you need to be evaluated.      If you received x-rays during your visit, be aware that the final and formal interpretation of those films by a radiologist may occur after your discharge.  If there is a significant discrepancy identified after your discharge, we will contact you at the telephone number provided at registration.      If you received a pelvic exam, you may have cultures pending for sexually transmitted diseases.  Positive cultures are reported to the [facility] Department of Health as required by state law.  You should be contacted if you cultures are positive.  We will not contact you if they are negative.  You did NOT receive a PAP smear (the screening test for cervical).  This specific test for women is best performed by your gynecologist or primary care provider when indicated.      If you are over 66 year old, we cannot discuss your personal health information with a parent, spouse, family member, or anyone else without your express consent.  This does not include those who have legitimate access to your records and information to assist in your care under the provisions of HIPAA [facility] Portability and Accountability Act) law, or those to whom you have previously given express written consent to do so, such a legal guardian or Power of [place].  You may have received medication that may cause you to feel drowsy and/or light headed for several hours.  You may even experience some amnesia of your stay.  You should avoid operating a motor vehicle or performing any activity requiring complete alertness or coordination until you feel fully awake (approximately 24-48 hours).  Avoid alcoholic beverages.  You may also have a dry mouth for several hours.  This is a normal side effect and will disappear as the effects of the medication wear off.      Instructions discussed  with patient upon discharge by clinical staff with all questions answered.  Please call [facility] [phone] if any further questions.  Go immediately to the emergency department if any concern or worsening symptoms.    Fredricka Bonine, MD 07/29/2021, 10:12

## 2021-07-29 NOTE — Nursing Note (Signed)
BP (!) 143/90 (Site: Left, Patient Position: Sitting, Cuff Size: Adult)   Pulse 71   Temp 36.6 C (97.8 F) (Tympanic)   Ht 1.778 m (5\' 10" )   Wt 101 kg (222 lb 3.2 oz)   SpO2 98%   BMI 31.88 kg/m     , RTR

## 2022-12-27 ENCOUNTER — Other Ambulatory Visit: Payer: Self-pay

## 2022-12-27 ENCOUNTER — Encounter (INDEPENDENT_AMBULATORY_CARE_PROVIDER_SITE_OTHER): Payer: Self-pay | Admitting: Family

## 2022-12-27 ENCOUNTER — Ambulatory Visit (INDEPENDENT_AMBULATORY_CARE_PROVIDER_SITE_OTHER): Payer: 59 | Admitting: Family

## 2022-12-27 VITALS — BP 154/97 | HR 87 | Temp 97.8°F | Ht 69.9 in | Wt 229.0 lb

## 2022-12-27 DIAGNOSIS — H6992 Unspecified Eustachian tube disorder, left ear: Secondary | ICD-10-CM

## 2022-12-27 NOTE — Progress Notes (Signed)
URGENT CARE, [facility]  [address]  [place] [address]       Name: Richard Ochoa MRN:  [id number]   Date: 12/27/2022 Age: 62 y.o.       Chief Complaint: Ear Fullness (Left - X 1 Day) and Earache      HPI: Richard Ochoa is a 62 y.o. male who presents today with left ear fullness and earache x1 day.  He felt it someone over the last week but more persistent in 1 day.  Has not been sick recently but does have some some nasal congestion most of the year.  Has been on Zyrtec for several years.  Denies fever, hearing loss and vomiting.      History:  Vital signs and history as obtained by clinical staff.  Past Medical History:   Diagnosis Date    Healthy adult     Stevens-Johnson syndrome (CMS HCC)          Past Surgical History:   Procedure Laterality Date    HX NO SURGICAL PROCEDURES           Family Medical History:       Problem Relation (Age of Onset)    Asthma Father    Cancer Maternal Aunt, Maternal Grandmother    Coronary Artery Disease Father    Healthy Mother    Heart Attack Father    Thyroid Disease Mother            Social History     Socioeconomic History    Marital status: Married     Spouse name: Tersa     Number of children: 2   Tobacco Use    Smoking status: Never    Smokeless tobacco: Never   Substance and Sexual Activity    Alcohol use: Yes     Comment: Occasional     Expanded Substance History     Additional history       Allergies:  Allergies   Allergen Reactions    Sulfa (Sulfonamides)      Problem List:  Patient Active Problem List    Diagnosis    Acute rhinosinusitis    Acute pharyngitis    Impacted cerumen     Medication:  Outpatient Encounter Medications as of 12/27/2022   Medication Sig Dispense Refill    losartan (COZAAR) 50 mg Oral Tablet Take 1 Tablet (50 mg total) by mouth Once a day      [DISCONTINUED] lisinopriL (PRINIVIL) 40 mg Oral Tablet take 1 tablet by oral route every day       No facility-administered encounter medications on file as of  12/27/2022.        No orders of the defined types were placed in this encounter.         Review of Systems   Pertinent items are noted in HPI. All pertinent positives and negatives noted in the HPI  Constitutional: No recent illness, no fever, no chills, no fatigue   Neuro: No dizziness, No lightheadedness, No syncope, no HA  Eyes: No blurring of vision or diplopia  ENT: No sore throat, + left ear pain, No hearing loss, No tinnitus, No rhinorrhea, No sinus pains, No nasal congestion, no PND  Respiratory: No cough, No shortness of breath, No wheezing,  Cardiovascular: no chest pain    GI: Denies abdominal pain, nausea, vomiting, diarrhea  Musculoskeletal: No myalgias, No arthralgias  Skin: no  rashes        OBJECTIVE:  Blood  pressure (!) 154/97, pulse 87, temperature 36.6 C (97.8 F), temperature source Tympanic, height 1.775 m (5' 9.9"), weight 104 kg (229 lb), SpO2 99%., Body mass index is 32.95 kg/m.  appears in good health and no distress  HEENT:  No tonsillar enlargement or exudate bilaterally, bilateral TMs with serous effusions but no erythema, ear canals clear, PERRLA, no sinus tenderness bilaterally, no cervical adenopathy  Extremities:  No edema, no cyanosis, positive bilateral peripheral pulses  Skin: Intact, No rashes    Assessment and Plan:    ICD-10-CM    1. Dysfunction of left eustachian tube  H69.92         Medication Orders   No Medications ordered      Claritin daily in a.m. X1 month.  Flonase 1 spray each nostril daily x3 weeks.  Suck on cough drops or hard candies to help eustachian tube drain  May take several weeks to improve.  If there is any worsening or you develop fever return to clinic.    Plan was discussed and patient/parent verbalized understanding.  If symptoms are not improving within the next couple of days,  advised patient/parent  to followup with primary care or return to the Urgent Care for further evaluation.  Go to Emergency Department immediately for further work up if  worsening symptoms or other medical concerns.        Floyce Stakes, NP 12/27/2022, 13:03    Portions of this note may be dictated using voice recognition software . Variances in spelling and vocabulary are possible and unintentional. Not all errors are caught/corrected. Please notify the [facility] Parkin if any discrepancies are noted or if the meaning of any statement is not clear      Supervision physician is Dr. Lawana Pai.

## 2022-12-27 NOTE — Nursing Note (Signed)
BP (!) 154/97 (Site: Left Arm, Patient Position: Sitting, Cuff Size: Adult Large)   Pulse 87   Temp 36.6 C (97.8 F) (Tympanic)   Ht 1.775 m (5' 9.9")   Wt 104 kg (229 lb)   SpO2 99%   BMI 32.95 kg/m   Marcy Siren, RTR
